# Patient Record
Sex: Male | Born: 1965
Health system: Southern US, Community
[De-identification: ages and names within clinical notes are randomized; demographics above are authoritative.]

## PROBLEM LIST (undated history)

## (undated) DIAGNOSIS — G44009 Cluster headache syndrome, unspecified, not intractable: Secondary | ICD-10-CM

## (undated) DIAGNOSIS — E7800 Pure hypercholesterolemia, unspecified: Secondary | ICD-10-CM

## (undated) DIAGNOSIS — I10 Essential (primary) hypertension: Secondary | ICD-10-CM

## (undated) DIAGNOSIS — E119 Type 2 diabetes mellitus without complications: Secondary | ICD-10-CM

## (undated) DIAGNOSIS — R972 Elevated prostate specific antigen [PSA]: Secondary | ICD-10-CM

## (undated) DIAGNOSIS — E049 Nontoxic goiter, unspecified: Secondary | ICD-10-CM

## (undated) DIAGNOSIS — L409 Psoriasis, unspecified: Secondary | ICD-10-CM

## (undated) HISTORY — DX: Cluster headache syndrome, unspecified, not intractable: G44.009

## (undated) HISTORY — DX: Type 2 diabetes mellitus without complications: E11.9

## (undated) HISTORY — DX: Essential (primary) hypertension: I10

## (undated) HISTORY — DX: Elevated prostate specific antigen (PSA): R97.20

## (undated) HISTORY — DX: Pure hypercholesterolemia, unspecified: E78.00

## (undated) HISTORY — DX: Nontoxic goiter, unspecified: E04.9

## (undated) HISTORY — DX: Psoriasis, unspecified: L40.9

---

## 2011-10-22 ENCOUNTER — Other Ambulatory Visit: Payer: Self-pay | Admitting: Neurology

## 2011-10-22 DIAGNOSIS — R52 Pain, unspecified: Secondary | ICD-10-CM

## 2011-10-23 ENCOUNTER — Ambulatory Visit
Admission: RE | Admit: 2011-10-23 | Discharge: 2011-10-23 | Disposition: A | Discharge status: Home / Self Care | Payer: Managed Care, Other (non HMO) | Source: Ambulatory Visit | Attending: Neurology | Admitting: Neurology

## 2011-10-23 DIAGNOSIS — R52 Pain, unspecified: Secondary | ICD-10-CM

## 2013-08-02 IMAGING — US US CAROTID DUPLEX BILAT
1 series · 13 of 24 positions shown · non-contrast
Comparison: None

CLINICAL DATA: Right-sided pain, carotidynia

BILATERAL CAROTID DUPLEX ULTRASOUND
TECHNIQUE: Gray scale imaging, color Doppler and duplex ultrasound
was performed of bilateral carotid and vertebral arteries in the
neck.

[Series 1: us carotid duplex bilat · 0.08mm/px · 13 of 57 slices shown]
[im 1/57]
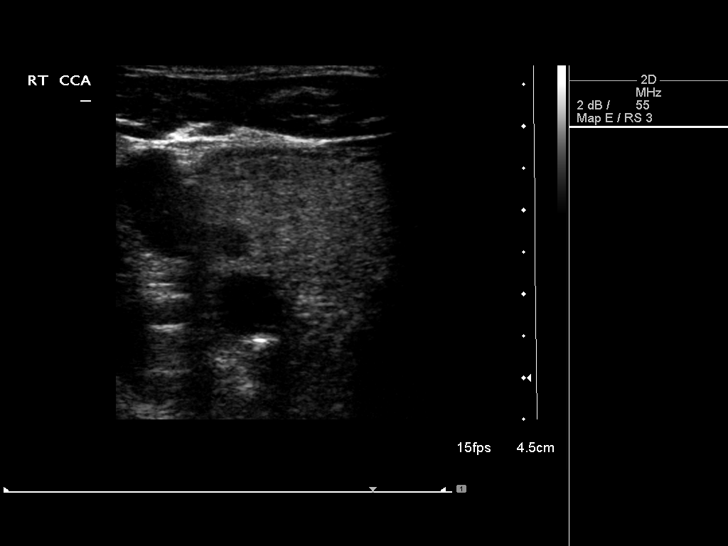
[im 5/57]
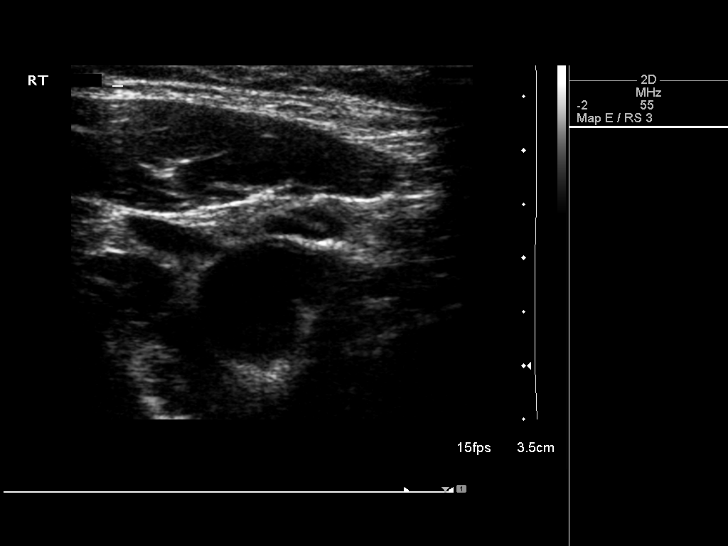
[im 10/57]
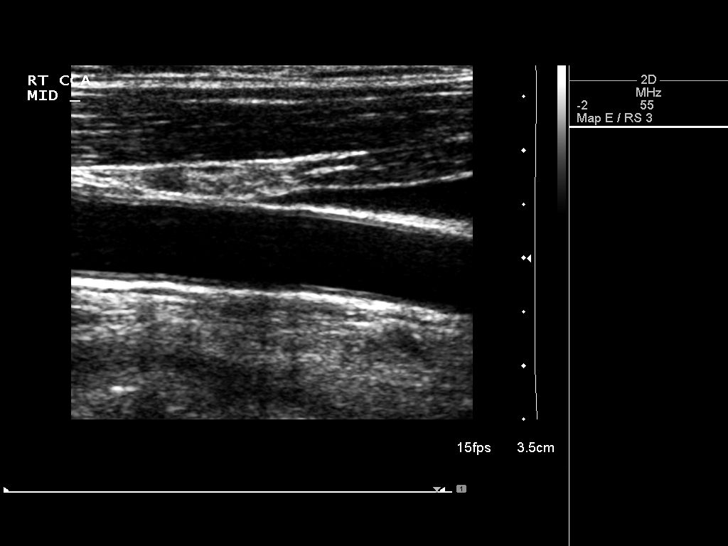
[im 15/57]
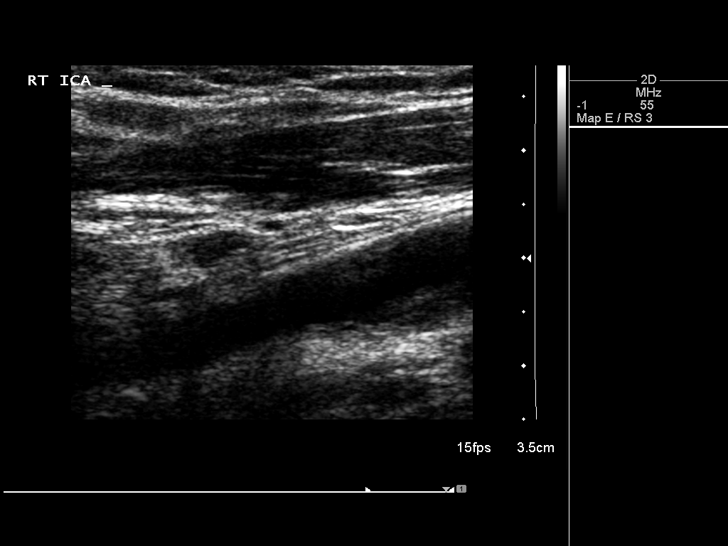
[im 20/57]
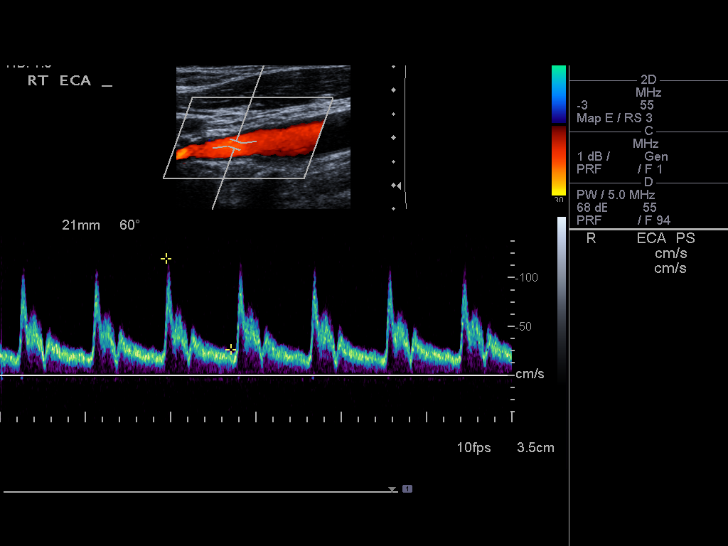
[im 25/57]
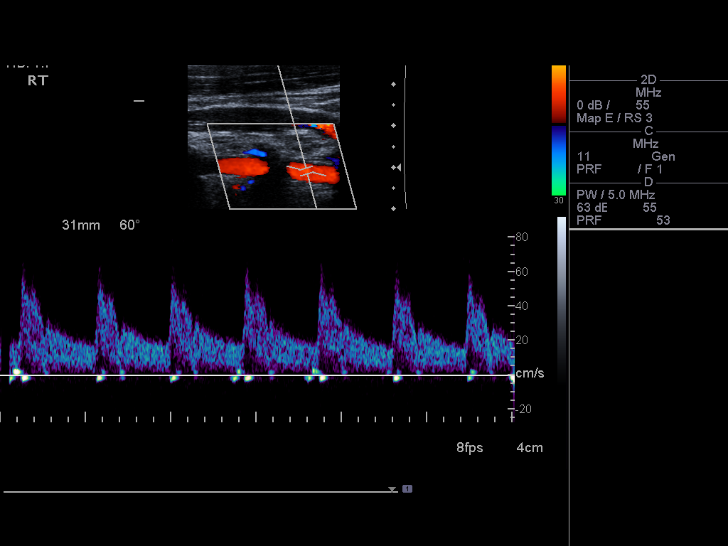
[im 30/57]
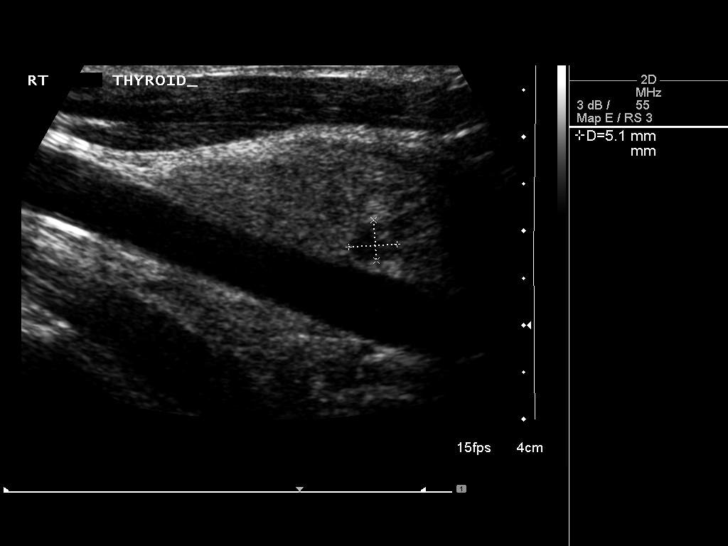
[im 32/57]
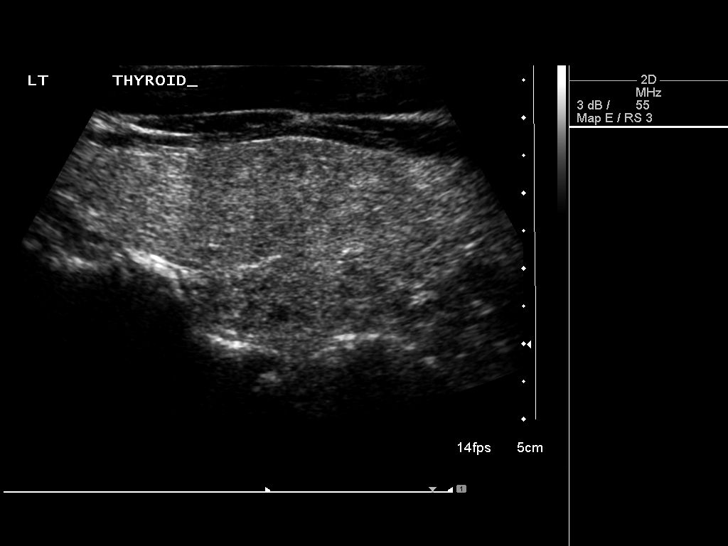
[im 37/57]
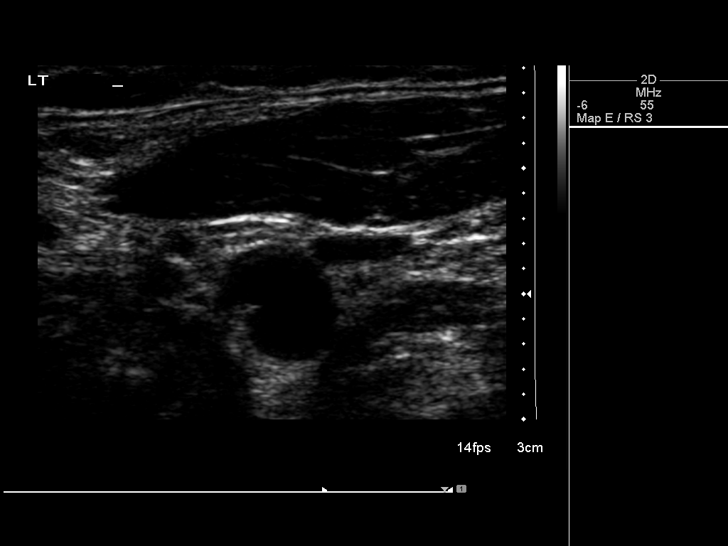
[im 42/57]
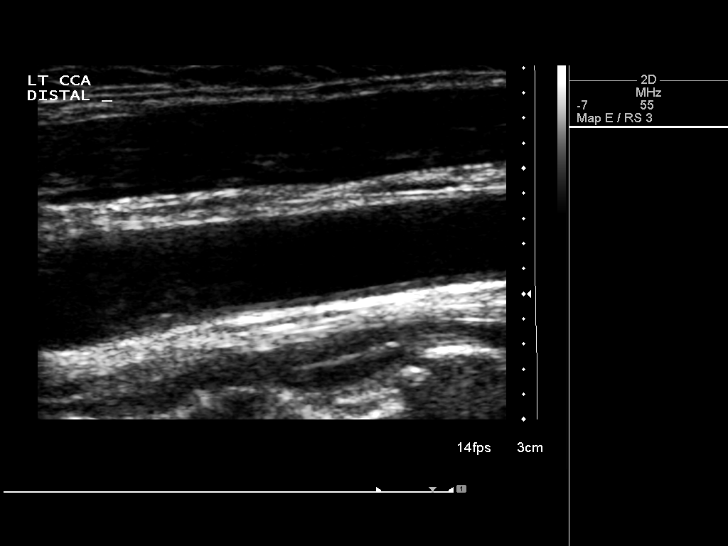
[im 47/57]
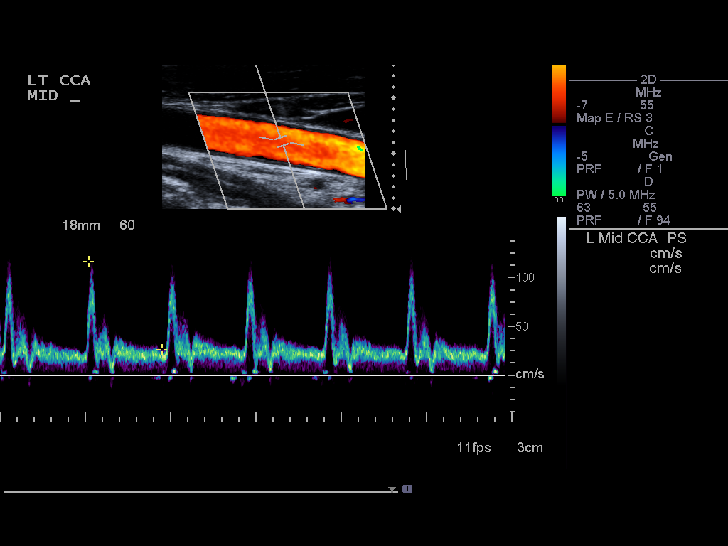
[im 52/57]
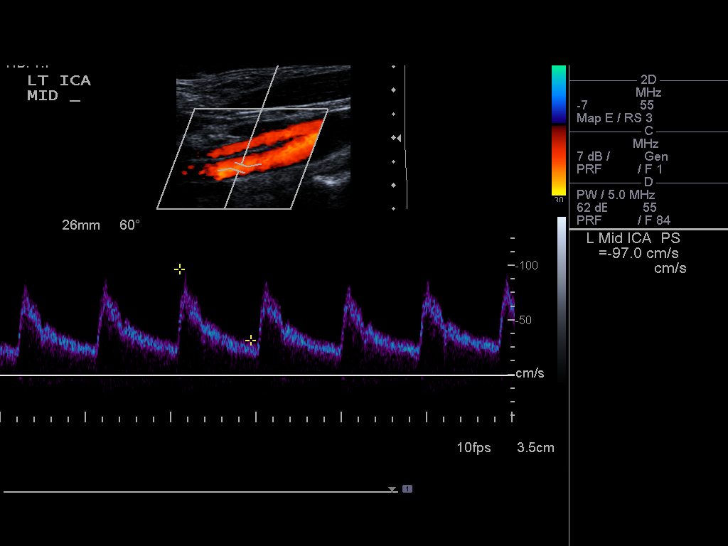
[im 57/57]
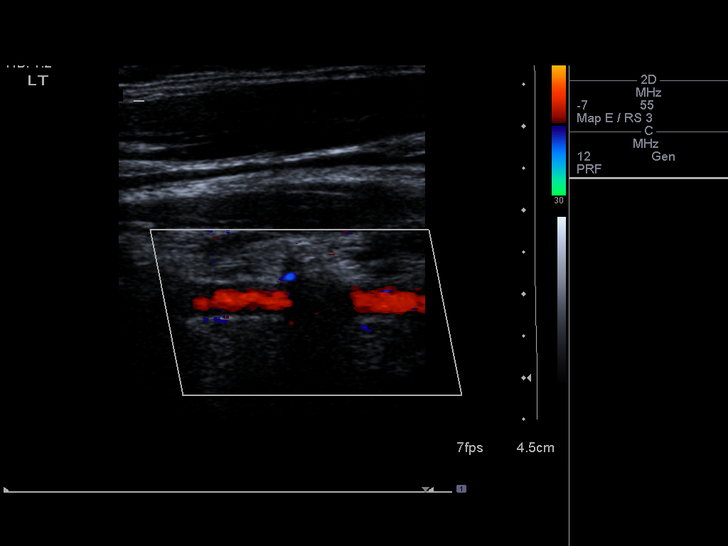

[13 of 24 positions shown; findings below may reference images not displayed]

Criteria:  Quantification of carotid stenosis is based on velocity
parameters that correlate the residual internal carotid diameter
with NASCET-based stenosis levels, using the diameter of the distal
internal carotid lumen as the denominator for stenosis measurement.

The following velocity measurements were obtained:

                 PEAK SYSTOLIC/END DIASTOLIC
RIGHT
ICA:                        87/27cm/sec
CCA:                        115/23cm/sec
SYSTOLIC ICA/CCA RATIO:
DIASTOLIC ICA/CCA RATIO:
ECA:                        120cm/sec

LEFT
ICA:                        97/32cm/sec
CCA:                        117/26cm/sec
SYSTOLIC ICA/CCA RATIO:
DIASTOLIC ICA/CCA RATIO:
ECA:                        132cm/sec
FINDINGS: RIGHT CAROTID ARTERY: Mild noncalcified plaque in the carotid bulb
extending to the proximal ICA without high-grade stenosis.  Normal
wave forms and color Doppler signal.  No dissection flap is
evident.  A 6 mm hypoechoic right thyroid nodule is incidentally
noted.

RIGHT VERTEBRAL ARTERY:  Normal flow direction and waveform.

LEFT CAROTID ARTERY: Mild noncalcified plaque in the carotid bulb
extending to the ICA origin without significant stenosis.  Normal
wave forms and color Doppler signal.  No dissection flap is
evident.

LEFT VERTEBRAL ARTERY:  Normal flow direction and waveform.
IMPRESSION: 1.  Mild bilateral carotid bifurcation plaque resulting in less
than 50% diameter stenosis. The exam does not exclude plaque
ulceration or embolization.  Continued surveillance recommended.
2.  Small right thyroid nodule.  Consider dedicated thyroid
ultrasound for complete evaluation.

## 2019-02-15 ENCOUNTER — Ambulatory Visit: Payer: Self-pay | Admitting: Podiatry

## 2019-05-05 DIAGNOSIS — R972 Elevated prostate specific antigen [PSA]: Secondary | ICD-10-CM | POA: Diagnosis not present

## 2019-06-22 DIAGNOSIS — G43809 Other migraine, not intractable, without status migrainosus: Secondary | ICD-10-CM | POA: Diagnosis not present

## 2019-07-05 DIAGNOSIS — G44019 Episodic cluster headache, not intractable: Secondary | ICD-10-CM | POA: Diagnosis not present

## 2019-07-05 DIAGNOSIS — F5102 Adjustment insomnia: Secondary | ICD-10-CM | POA: Diagnosis not present

## 2019-07-05 DIAGNOSIS — G43009 Migraine without aura, not intractable, without status migrainosus: Secondary | ICD-10-CM | POA: Diagnosis not present

## 2019-07-18 DIAGNOSIS — E7800 Pure hypercholesterolemia, unspecified: Secondary | ICD-10-CM | POA: Diagnosis not present

## 2019-07-18 DIAGNOSIS — G44009 Cluster headache syndrome, unspecified, not intractable: Secondary | ICD-10-CM | POA: Diagnosis not present

## 2019-07-18 DIAGNOSIS — Z6824 Body mass index (BMI) 24.0-24.9, adult: Secondary | ICD-10-CM | POA: Diagnosis not present

## 2019-07-18 DIAGNOSIS — E1165 Type 2 diabetes mellitus with hyperglycemia: Secondary | ICD-10-CM | POA: Diagnosis not present

## 2019-07-18 DIAGNOSIS — Z23 Encounter for immunization: Secondary | ICD-10-CM | POA: Diagnosis not present

## 2019-07-19 DIAGNOSIS — G44019 Episodic cluster headache, not intractable: Secondary | ICD-10-CM | POA: Diagnosis not present

## 2019-08-04 DIAGNOSIS — R05 Cough: Secondary | ICD-10-CM | POA: Diagnosis not present

## 2019-08-04 DIAGNOSIS — Z20828 Contact with and (suspected) exposure to other viral communicable diseases: Secondary | ICD-10-CM | POA: Diagnosis not present

## 2019-08-04 DIAGNOSIS — K591 Functional diarrhea: Secondary | ICD-10-CM | POA: Diagnosis not present

## 2019-09-11 DIAGNOSIS — E1165 Type 2 diabetes mellitus with hyperglycemia: Secondary | ICD-10-CM | POA: Diagnosis not present

## 2019-09-11 DIAGNOSIS — R5383 Other fatigue: Secondary | ICD-10-CM | POA: Diagnosis not present

## 2019-09-11 DIAGNOSIS — Z6824 Body mass index (BMI) 24.0-24.9, adult: Secondary | ICD-10-CM | POA: Diagnosis not present

## 2019-09-11 DIAGNOSIS — G44009 Cluster headache syndrome, unspecified, not intractable: Secondary | ICD-10-CM | POA: Diagnosis not present

## 2019-09-11 DIAGNOSIS — R5381 Other malaise: Secondary | ICD-10-CM | POA: Diagnosis not present

## 2019-09-11 DIAGNOSIS — D72829 Elevated white blood cell count, unspecified: Secondary | ICD-10-CM | POA: Diagnosis not present

## 2019-09-19 ENCOUNTER — Encounter: Payer: Self-pay | Admitting: *Deleted

## 2019-09-20 ENCOUNTER — Ambulatory Visit: Payer: BC Managed Care – PPO | Admitting: Diagnostic Neuroimaging

## 2019-09-20 ENCOUNTER — Encounter: Payer: Self-pay | Admitting: Diagnostic Neuroimaging

## 2019-09-20 ENCOUNTER — Other Ambulatory Visit: Payer: Self-pay

## 2019-09-20 VITALS — BP 132/81 | HR 69 | Temp 97.7°F | Ht 69.0 in | Wt 178.0 lb

## 2019-09-20 DIAGNOSIS — G44011 Episodic cluster headache, intractable: Secondary | ICD-10-CM | POA: Diagnosis not present

## 2019-09-20 NOTE — Progress Notes (Signed)
[facility]  PATIENT: Richard Clay DOB: 01/12/1966  REFERRING CLINICIAN: Noni Saupe, MD  HISTORY FROM: patient  REASON FOR VISIT: new consult / second opinion   HISTORICAL  CHIEF COMPLAINT:  Chief Complaint  Patient presents with  . Cluster headache    rm 6 New Pt "right sided headaches, cluster; began age 53's, tx from past not working any more"    HISTORY OF PRESENT ILLNESS:   NEW HPI (09/20/19, VRP): 53 year old male here for evaluation of second opinion of cluster headaches.  Outside records were reviewed and summarized.  Patient started having headaches starting around age 27 or 54 years old.  He was living in [place] at the time.  He was having intermittent headaches over his right eye, right nose, right head, sharp intense pain lasting a few minutes or few hours at a time.  This was associated with scleral injection, eye pain, neck pain, sensitivity to light and sound, nausea and vomiting, nasal congestion on the right side.  Sometimes pain would radiate to the is right shoulder and right neck.  Triggering factors which include dry air, heat, alcohol.  Patient was diagnosed with episodic cluster headaches and started on propranolol which helped initially.    Then patient moved to [place] was seen by headache specialist Dr. Clarisse Gouge, and continued on propranolol.  He would have about 4-5 headache clusters per year, with a seasonal pattern.  Typically small dose of prednisone and allergy medicine Zyrtec would seem to relieve symptoms.  Then patient saw Dr. Caryn Section and Dr. Sharene Skeans headache specialists in 2019 and 2020.  He was tried on amlodipine, propranolol and prescribed sumatriptan.  Patient never tried the sumatriptan.  Patient was also prescribed gabapentin but he never tried this.  Propranolol was decreased and tapered off and amlodipine was increased but this did not seem to help.  Patient has tried oxygen in the past but he is not sure how much  this helped (this may have been more than 20 years ago).  Patient is frustrated, anxious, scared of new medications, scared of new changes.  He would prefer to be on daily prednisone although this is of some medical concern due to his hypertension and diabetes.  Patient has tried in the past propranolol, olmesartan, verapamil, Topamax, Emgality, sumatriptan.   03/30/18 Dr. Caryn Section: "This is a recurrent problem. The current episode started more than 1 year ago. The problem occurs intermittently. The problem has been gradually worsening. The pain is located in the occipital and right unilateral region. The pain radiates to the upper back and right neck. The quality of the pain is described as throbbing, dull and squeezing (pressure). Associated symptoms include eye pain, eye redness, nausea, neck pain, phonophobia, photophobia and vomiting. Pertinent negatives include no blurred vision or dizziness. The symptoms are aggravated by alcohol, bright light and noise (heat, dry air). He has tried triptans, beta blockers and oxygen for the symptoms. There is no history of migraines in the family.   This is the first visit for this patient to this clinic. Pt has had headaches for many years. He was diagnosed w/ episodic cluster headaches by a neurologist in [place]. He has seen Dr Amelia Jo in the past also. Propranolol helped helped decrease HAs x 4-5 y but they returned recently (1 month ago). Pt will get HAs that last 1-9monutes up to 4+ hours if not treated. Pt has taken prednisone at onset of HAs and it usually helps. Sometimes he had  to take several rounds. He just finished 2nd round on Sunday. Pt tried sumatriptan 1/4 tab but it made him numb all over. It did help him sleep and relieved HA.  Headaches usually start right sided neck pain on right, move up and over head to face next to nose. Sometimes they last less than 3 m especially if he does stretches. Sometimes they last at least 4h. He has HAs about 1-1.5h  after going to sleep- at about 9:30 every night this past month. Pt has rhinorrhea, post nasal drainage (if he swallow the drainage, HAs may decrease), coughing, eye swelling. He tries to relax to manage the pain. Then he might need to stretch or eventually pace/ move. Pain can be unbearable. He tried oxygen many years ago but now prefers to have a fan blowing on his face w/ cool air. He denies a visual aura.   Pt has HTN controlled by meds and he avoids NSAID's.  Duration: 4-72h Frequency: daily Progression: waxing and waning  Pt does drink caffeinated beverages daily. Pt does drink alcoholic beverages. Pt does smoke. Pt does not exercise regularly. Pt has irregular sleep patterns when in a bad cycle. 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  Current and past medications: ANTI-MIGRAINE: Imitrex- made him numb all over HEART/BP: Norvasc, propranolol, olmesartan, Verapamil ANTI-CONVULSANTS: Topamax"    REVIEW OF SYSTEMS: Full 14 system review of systems performed and negative with exception of: As per HPI.  Headache weakness cough not no sleep decreased energy.  ALLERGIES: Allergies  Allergen Reactions  . Augmentin [Amoxicillin-Pot Clavulanate] Nausea Only  . Cefdinir Nausea And Vomiting    diarrhea  . Verapamil Hcl Er Nausea And Vomiting    HOME MEDICATIONS: Outpatient Medications Prior to Visit  Medication Sig Dispense Refill  . amLODipine (NORVASC) 5 MG tablet Take 5 mg by mouth daily.    . Aspirin Buf,CaCarb-MgCarb-MgO, 81 MG TABS Take 81 mg by mouth daily.    . cetirizine (ZYRTEC) 10 MG tablet Take 10 mg by mouth daily.    Marland Kitchen CINNAMON PO Take 2,000 mg by mouth daily.    Marland Kitchen EMGALITY 120 MG/ML SOAJ ADM 1 PEN [place] Q 30 DAYS    . glimepiride (AMARYL) 2 MG tablet Take 2 mg by mouth daily.    . metFORMIN (GLUCOPHAGE-XR) 500 MG 24 hr tablet Take 1,000 mg by mouth at bedtime.    Marland Kitchen olmesartan (BENICAR) 20 MG tablet Take 20 mg by mouth daily.    . Omega-3 1000 MG CAPS Take by mouth.    .  Probiotic Product (PROBIOTIC PO) Take by mouth. Couple x week    . propranolol (INDERAL) 40 MG tablet Take 40 mg by mouth daily.    . Vitamins/Minerals TABS Take by mouth.    . gabapentin (NEURONTIN) 300 MG capsule Take 300 mg by mouth at bedtime. 09/20/19 not taking    . Ubrogepant (UBRELVY) 100 MG TABS Take 100 mg by mouth. 09/20/19 not taking     No facility-administered medications prior to visit.    PAST MEDICAL HISTORY: Past Medical History:  Diagnosis Date  . Cluster headache   . Diabetes (HCC)    type 2  . Elevated PSA   . Goiter   . High cholesterol   . Hypertension   . Psoriasis     PAST SURGICAL HISTORY: Past Surgical History:  Procedure Laterality Date  . ORIF ANKLE FRACTURE Left     FAMILY HISTORY: Family History  Problem Relation Age of Onset  .  Diabetes Father   . Lung cancer Father   . Prostate cancer Father   . Kidney failure Brother        dialysis  . Cancer Mother        breast  . Cancer Paternal Uncle   . Cancer Paternal Grandfather        bone    SOCIAL HISTORY: Social History   Socioeconomic History  . Marital status: Married    Spouse name: Seth Bake  . Number of children: 2  . Years of education: college  . Highest education level: Not on file  Occupational History  . Not on file  Tobacco Use  . Smoking status: Current Every Day Smoker    Packs/day: 1.00  . Smokeless tobacco: Never Used  Substance and Sexual Activity  . Alcohol use: Yes    Alcohol/week: 1.0 standard drinks    Types: 1 Glasses of wine per week    Comment: 09/19/19  very little  . Drug use: Not Currently  . Sexual activity: Not on file  Other Topics Concern  . Not on file  Social History Narrative   Lives with wife   Caffeine- decaf   Social Determinants of Health   Financial Resource Strain:   . Difficulty of Paying Living Expenses: Not on file  Food Insecurity:   . Worried About Charity fundraiser in the Last Year: Not on file  . Ran Out of Food in the  Last Year: Not on file  Transportation Needs:   . Lack of Transportation (Medical): Not on file  . Lack of Transportation (Non-Medical): Not on file  Physical Activity:   . Days of Exercise per Week: Not on file  . Minutes of Exercise per Session: Not on file  Stress:   . Feeling of Stress : Not on file  Social Connections:   . Frequency of Communication with Friends and Family: Not on file  . Frequency of Social Gatherings with Friends and Family: Not on file  . Attends Religious Services: Not on file  . Active Member of Clubs or Organizations: Not on file  . Attends Archivist Meetings: Not on file  . Marital Status: Not on file  Intimate Partner Violence:   . Fear of Current or Ex-Partner: Not on file  . Emotionally Abused: Not on file  . Physically Abused: Not on file  . Sexually Abused: Not on file     PHYSICAL EXAM  GENERAL EXAM/CONSTITUTIONAL: Vitals:  Vitals:   09/20/19 0902  BP: 132/81  Pulse: 69  Temp: 97.7 F (36.5 C)  Weight: 178 lb (80.7 kg)  Height: 5\' 9"  (1.753 m)     Body mass index is 26.29 kg/m. Wt Readings from Last 3 Encounters:  09/20/19 178 lb (80.7 kg)     Patient is in no distress; well developed, nourished and groomed; neck is supple  CARDIOVASCULAR:  Examination of carotid arteries is normal; no carotid bruits  Regular rate and rhythm, no murmurs  Examination of peripheral vascular system by observation and palpation is normal  EYES:  Ophthalmoscopic exam of optic discs and posterior segments is normal; no papilledema or hemorrhages  No exam data present  MUSCULOSKELETAL:  Gait, strength, tone, movements noted in Neurologic exam below  NEUROLOGIC: MENTAL STATUS:  No flowsheet data found.  awake, alert, oriented to person, place and time  recent and remote memory intact  normal attention and concentration  language fluent, comprehension intact, naming intact  fund of knowledge appropriate  CRANIAL  NERVE:   2nd - no papilledema on fundoscopic exam  2nd, 3rd, 4th, 6th - pupils equal and reactive to light, visual fields full to confrontation, extraocular muscles intact, no nystagmus  5th - facial sensation symmetric  7th - facial strength symmetric  8th - hearing intact  9th - palate elevates symmetrically, uvula midline  11th - shoulder shrug symmetric  12th - tongue protrusion midline  MOTOR:   normal bulk and tone, full strength in the BUE, BLE  SENSORY:   normal and symmetric to light touch, temperature, vibration  COORDINATION:   finger-nose-finger, fine finger movements normal  REFLEXES:   deep tendon reflexes present and symmetric  GAIT/STATION:   narrow based gait     DIAGNOSTIC DATA (LABS, IMAGING, TESTING) - I reviewed patient records, labs, notes, testing and imaging myself where available.  No results found for: WBC, HGB, HCT, MCV, PLT No results found for: NA, K, CL, CO2, GLUCOSE, BUN, CREATININE, CALCIUM, PROT, ALBUMIN, AST, ALT, ALKPHOS, BILITOT, GFRNONAA, GFRAA No results found for: CHOL, HDL, LDLCALC, LDLDIRECT, TRIG, CHOLHDL No results found for: [id number] No results found for: VITAMINB12 No results found for: TSH     ASSESSMENT AND PLAN  53 y.o. year old male here with history of episodic cluster headaches since age 53 years old, currently on propranolol, amlodipine, emgality.   Dx:  1. Intractable episodic cluster headache     PLAN:  INTRACTABLE CLUSTER HEADACHES (second opinion) - CLUSTER PREVENTION --> continue emgality 120mg  monthly, propranolol 40mg  daily, amlodipine 5mg   - CLUSTER RESCUE --> consider sumatriptan as needed (if patient agrees); consider home oxygen  - Reviewed other options of medication treatment, but patient is reluctant to try anything new at this time; recommend he continue current treatment and follow-up with headache specialist.  Return return to headache specialist, for return to PCP, pending if  symptoms worsen or fail to improve.    Suanne MarkerVIKRAM R. Eleisha Branscomb, MD 09/20/2019, 9:56 AM Certified in Neurology, Neurophysiology and Neuroimaging  [facility] [address], [address] [place], [place] [address] [phone] a

## 2019-09-20 NOTE — Patient Instructions (Signed)
INTRACTABLE CLUSTER HEADACHES   - CLUSTER PREVENTION --> continue emgality 120mg  monthly, propranolol 40mg  daily, amlodipine 5mg   - CLUSTER RESCUE --> consider sumatriptan as needed; consider home oxygen

## 2019-10-04 DIAGNOSIS — G44019 Episodic cluster headache, not intractable: Secondary | ICD-10-CM | POA: Diagnosis not present

## 2019-10-20 DIAGNOSIS — F17200 Nicotine dependence, unspecified, uncomplicated: Secondary | ICD-10-CM | POA: Diagnosis not present

## 2019-10-20 DIAGNOSIS — G44009 Cluster headache syndrome, unspecified, not intractable: Secondary | ICD-10-CM | POA: Diagnosis not present

## 2019-10-20 DIAGNOSIS — R899 Unspecified abnormal finding in specimens from other organs, systems and tissues: Secondary | ICD-10-CM | POA: Diagnosis not present

## 2019-10-20 DIAGNOSIS — Z6825 Body mass index (BMI) 25.0-25.9, adult: Secondary | ICD-10-CM | POA: Diagnosis not present

## 2019-10-20 DIAGNOSIS — R7989 Other specified abnormal findings of blood chemistry: Secondary | ICD-10-CM | POA: Diagnosis not present

## 2019-10-20 DIAGNOSIS — E049 Nontoxic goiter, unspecified: Secondary | ICD-10-CM | POA: Diagnosis not present

## 2019-10-20 DIAGNOSIS — E1165 Type 2 diabetes mellitus with hyperglycemia: Secondary | ICD-10-CM | POA: Diagnosis not present

## 2019-10-25 DIAGNOSIS — E049 Nontoxic goiter, unspecified: Secondary | ICD-10-CM | POA: Diagnosis not present

## 2019-10-31 DIAGNOSIS — G44011 Episodic cluster headache, intractable: Secondary | ICD-10-CM | POA: Diagnosis not present

## 2019-12-05 DIAGNOSIS — Z20828 Contact with and (suspected) exposure to other viral communicable diseases: Secondary | ICD-10-CM | POA: Diagnosis not present

## 2020-01-12 DIAGNOSIS — R899 Unspecified abnormal finding in specimens from other organs, systems and tissues: Secondary | ICD-10-CM | POA: Diagnosis not present

## 2020-01-12 DIAGNOSIS — Z1331 Encounter for screening for depression: Secondary | ICD-10-CM | POA: Diagnosis not present

## 2020-01-12 DIAGNOSIS — I10 Essential (primary) hypertension: Secondary | ICD-10-CM | POA: Diagnosis not present

## 2020-01-12 DIAGNOSIS — Z6825 Body mass index (BMI) 25.0-25.9, adult: Secondary | ICD-10-CM | POA: Diagnosis not present

## 2020-01-12 DIAGNOSIS — E1165 Type 2 diabetes mellitus with hyperglycemia: Secondary | ICD-10-CM | POA: Diagnosis not present

## 2020-01-25 DIAGNOSIS — F17200 Nicotine dependence, unspecified, uncomplicated: Secondary | ICD-10-CM | POA: Diagnosis not present

## 2020-01-25 DIAGNOSIS — J3089 Other allergic rhinitis: Secondary | ICD-10-CM | POA: Diagnosis not present

## 2020-04-30 DIAGNOSIS — F17200 Nicotine dependence, unspecified, uncomplicated: Secondary | ICD-10-CM | POA: Diagnosis not present

## 2020-04-30 DIAGNOSIS — J329 Chronic sinusitis, unspecified: Secondary | ICD-10-CM | POA: Diagnosis not present

## 2020-04-30 DIAGNOSIS — Z20828 Contact with and (suspected) exposure to other viral communicable diseases: Secondary | ICD-10-CM | POA: Diagnosis not present

## 2020-04-30 DIAGNOSIS — J40 Bronchitis, not specified as acute or chronic: Secondary | ICD-10-CM | POA: Diagnosis not present

## 2020-10-24 DIAGNOSIS — E1121 Type 2 diabetes mellitus with diabetic nephropathy: Secondary | ICD-10-CM | POA: Diagnosis not present

## 2020-10-24 DIAGNOSIS — Z125 Encounter for screening for malignant neoplasm of prostate: Secondary | ICD-10-CM | POA: Diagnosis not present

## 2020-10-24 DIAGNOSIS — K219 Gastro-esophageal reflux disease without esophagitis: Secondary | ICD-10-CM | POA: Diagnosis not present

## 2020-10-24 DIAGNOSIS — E7800 Pure hypercholesterolemia, unspecified: Secondary | ICD-10-CM | POA: Diagnosis not present

## 2020-10-24 DIAGNOSIS — Z6827 Body mass index (BMI) 27.0-27.9, adult: Secondary | ICD-10-CM | POA: Diagnosis not present

## 2021-01-09 ENCOUNTER — Encounter: Payer: Self-pay | Admitting: Gastroenterology

## 2021-02-12 DIAGNOSIS — L958 Other vasculitis limited to the skin: Secondary | ICD-10-CM | POA: Diagnosis not present

## 2021-02-12 DIAGNOSIS — Z79899 Other long term (current) drug therapy: Secondary | ICD-10-CM | POA: Diagnosis not present

## 2021-02-12 DIAGNOSIS — Z111 Encounter for screening for respiratory tuberculosis: Secondary | ICD-10-CM | POA: Diagnosis not present

## 2021-02-12 DIAGNOSIS — L400 Psoriasis vulgaris: Secondary | ICD-10-CM | POA: Diagnosis not present

## 2021-05-27 DIAGNOSIS — E119 Type 2 diabetes mellitus without complications: Secondary | ICD-10-CM | POA: Diagnosis not present

## 2021-05-27 DIAGNOSIS — E785 Hyperlipidemia, unspecified: Secondary | ICD-10-CM | POA: Diagnosis not present

## 2021-05-27 DIAGNOSIS — Z79899 Other long term (current) drug therapy: Secondary | ICD-10-CM | POA: Diagnosis not present

## 2021-05-27 DIAGNOSIS — Z87891 Personal history of nicotine dependence: Secondary | ICD-10-CM | POA: Diagnosis not present

## 2021-05-27 DIAGNOSIS — R972 Elevated prostate specific antigen [PSA]: Secondary | ICD-10-CM | POA: Diagnosis not present

## 2021-05-27 DIAGNOSIS — L259 Unspecified contact dermatitis, unspecified cause: Secondary | ICD-10-CM | POA: Diagnosis not present

## 2021-05-27 DIAGNOSIS — E1169 Type 2 diabetes mellitus with other specified complication: Secondary | ICD-10-CM | POA: Diagnosis not present

## 2021-06-06 DIAGNOSIS — R972 Elevated prostate specific antigen [PSA]: Secondary | ICD-10-CM | POA: Diagnosis not present

## 2021-06-06 DIAGNOSIS — L2089 Other atopic dermatitis: Secondary | ICD-10-CM | POA: Diagnosis not present

## 2021-06-06 DIAGNOSIS — L0390 Cellulitis, unspecified: Secondary | ICD-10-CM | POA: Diagnosis not present

## 2021-06-10 DIAGNOSIS — L400 Psoriasis vulgaris: Secondary | ICD-10-CM | POA: Diagnosis not present

## 2021-06-10 DIAGNOSIS — L237 Allergic contact dermatitis due to plants, except food: Secondary | ICD-10-CM | POA: Diagnosis not present

## 2021-06-10 DIAGNOSIS — L299 Pruritus, unspecified: Secondary | ICD-10-CM | POA: Diagnosis not present

## 2021-06-16 DIAGNOSIS — Z87891 Personal history of nicotine dependence: Secondary | ICD-10-CM | POA: Diagnosis not present

## 2021-06-16 DIAGNOSIS — J449 Chronic obstructive pulmonary disease, unspecified: Secondary | ICD-10-CM | POA: Diagnosis not present

## 2021-06-16 DIAGNOSIS — Z122 Encounter for screening for malignant neoplasm of respiratory organs: Secondary | ICD-10-CM | POA: Diagnosis not present

## 2021-06-16 DIAGNOSIS — F17210 Nicotine dependence, cigarettes, uncomplicated: Secondary | ICD-10-CM | POA: Diagnosis not present

## 2021-06-27 DIAGNOSIS — R972 Elevated prostate specific antigen [PSA]: Secondary | ICD-10-CM | POA: Diagnosis not present

## 2021-07-30 DIAGNOSIS — F419 Anxiety disorder, unspecified: Secondary | ICD-10-CM | POA: Diagnosis not present

## 2021-07-30 DIAGNOSIS — Z23 Encounter for immunization: Secondary | ICD-10-CM | POA: Diagnosis not present

## 2021-07-30 DIAGNOSIS — I10 Essential (primary) hypertension: Secondary | ICD-10-CM | POA: Diagnosis not present

## 2021-07-30 DIAGNOSIS — Z87891 Personal history of nicotine dependence: Secondary | ICD-10-CM | POA: Diagnosis not present

## 2021-09-08 DIAGNOSIS — J449 Chronic obstructive pulmonary disease, unspecified: Secondary | ICD-10-CM | POA: Diagnosis not present

## 2021-09-08 DIAGNOSIS — J441 Chronic obstructive pulmonary disease with (acute) exacerbation: Secondary | ICD-10-CM | POA: Diagnosis not present

## 2021-10-24 DIAGNOSIS — S00252A Superficial foreign body of left eyelid and periocular area, initial encounter: Secondary | ICD-10-CM | POA: Diagnosis not present

## 2021-10-24 DIAGNOSIS — R972 Elevated prostate specific antigen [PSA]: Secondary | ICD-10-CM | POA: Diagnosis not present

## 2021-10-24 DIAGNOSIS — J3489 Other specified disorders of nose and nasal sinuses: Secondary | ICD-10-CM | POA: Diagnosis not present

## 2021-10-24 DIAGNOSIS — X58XXXA Exposure to other specified factors, initial encounter: Secondary | ICD-10-CM | POA: Diagnosis not present

## 2021-11-17 DIAGNOSIS — R972 Elevated prostate specific antigen [PSA]: Secondary | ICD-10-CM | POA: Diagnosis not present

## 2021-11-17 DIAGNOSIS — J449 Chronic obstructive pulmonary disease, unspecified: Secondary | ICD-10-CM | POA: Diagnosis not present

## 2021-11-17 DIAGNOSIS — E119 Type 2 diabetes mellitus without complications: Secondary | ICD-10-CM | POA: Diagnosis not present

## 2021-11-17 DIAGNOSIS — J069 Acute upper respiratory infection, unspecified: Secondary | ICD-10-CM | POA: Diagnosis not present

## 2021-12-02 DIAGNOSIS — E1169 Type 2 diabetes mellitus with other specified complication: Secondary | ICD-10-CM | POA: Diagnosis not present

## 2021-12-02 DIAGNOSIS — E119 Type 2 diabetes mellitus without complications: Secondary | ICD-10-CM | POA: Diagnosis not present

## 2021-12-02 DIAGNOSIS — I10 Essential (primary) hypertension: Secondary | ICD-10-CM | POA: Diagnosis not present

## 2021-12-02 DIAGNOSIS — E785 Hyperlipidemia, unspecified: Secondary | ICD-10-CM | POA: Diagnosis not present

## 2021-12-02 DIAGNOSIS — Z79899 Other long term (current) drug therapy: Secondary | ICD-10-CM | POA: Diagnosis not present

## 2021-12-08 DIAGNOSIS — R972 Elevated prostate specific antigen [PSA]: Secondary | ICD-10-CM | POA: Diagnosis not present

## 2022-02-13 DIAGNOSIS — Z880 Allergy status to penicillin: Secondary | ICD-10-CM | POA: Diagnosis not present

## 2022-02-13 DIAGNOSIS — Z79899 Other long term (current) drug therapy: Secondary | ICD-10-CM | POA: Diagnosis not present

## 2022-02-13 DIAGNOSIS — R972 Elevated prostate specific antigen [PSA]: Secondary | ICD-10-CM | POA: Diagnosis not present

## 2022-02-13 DIAGNOSIS — E119 Type 2 diabetes mellitus without complications: Secondary | ICD-10-CM | POA: Diagnosis not present

## 2022-02-13 DIAGNOSIS — D291 Benign neoplasm of prostate: Secondary | ICD-10-CM | POA: Diagnosis not present

## 2022-02-13 DIAGNOSIS — I10 Essential (primary) hypertension: Secondary | ICD-10-CM | POA: Diagnosis not present

## 2022-02-13 DIAGNOSIS — F17210 Nicotine dependence, cigarettes, uncomplicated: Secondary | ICD-10-CM | POA: Diagnosis not present

## 2022-02-13 DIAGNOSIS — C61 Malignant neoplasm of prostate: Secondary | ICD-10-CM | POA: Diagnosis not present

## 2022-03-04 DIAGNOSIS — R972 Elevated prostate specific antigen [PSA]: Secondary | ICD-10-CM | POA: Diagnosis not present

## 2022-04-17 DIAGNOSIS — Z79899 Other long term (current) drug therapy: Secondary | ICD-10-CM | POA: Diagnosis not present

## 2022-04-17 DIAGNOSIS — E1169 Type 2 diabetes mellitus with other specified complication: Secondary | ICD-10-CM | POA: Diagnosis not present

## 2022-04-17 DIAGNOSIS — E119 Type 2 diabetes mellitus without complications: Secondary | ICD-10-CM | POA: Diagnosis not present

## 2022-04-17 DIAGNOSIS — I10 Essential (primary) hypertension: Secondary | ICD-10-CM | POA: Diagnosis not present

## 2022-04-17 DIAGNOSIS — R972 Elevated prostate specific antigen [PSA]: Secondary | ICD-10-CM | POA: Diagnosis not present

## 2022-04-17 DIAGNOSIS — E785 Hyperlipidemia, unspecified: Secondary | ICD-10-CM | POA: Diagnosis not present

## 2022-05-18 DIAGNOSIS — E785 Hyperlipidemia, unspecified: Secondary | ICD-10-CM | POA: Diagnosis not present

## 2022-05-18 DIAGNOSIS — M25512 Pain in left shoulder: Secondary | ICD-10-CM | POA: Diagnosis not present

## 2022-05-18 DIAGNOSIS — R972 Elevated prostate specific antigen [PSA]: Secondary | ICD-10-CM | POA: Diagnosis not present

## 2022-05-18 DIAGNOSIS — Z79899 Other long term (current) drug therapy: Secondary | ICD-10-CM | POA: Diagnosis not present

## 2022-05-18 DIAGNOSIS — E1169 Type 2 diabetes mellitus with other specified complication: Secondary | ICD-10-CM | POA: Diagnosis not present

## 2022-08-18 DIAGNOSIS — E785 Hyperlipidemia, unspecified: Secondary | ICD-10-CM | POA: Diagnosis not present

## 2022-08-18 DIAGNOSIS — R972 Elevated prostate specific antigen [PSA]: Secondary | ICD-10-CM | POA: Diagnosis not present

## 2022-08-18 DIAGNOSIS — Z79899 Other long term (current) drug therapy: Secondary | ICD-10-CM | POA: Diagnosis not present

## 2022-08-18 DIAGNOSIS — E1169 Type 2 diabetes mellitus with other specified complication: Secondary | ICD-10-CM | POA: Diagnosis not present

## 2022-08-18 DIAGNOSIS — E119 Type 2 diabetes mellitus without complications: Secondary | ICD-10-CM | POA: Diagnosis not present

## 2022-10-02 DIAGNOSIS — G8929 Other chronic pain: Secondary | ICD-10-CM | POA: Diagnosis not present

## 2022-10-02 DIAGNOSIS — M25512 Pain in left shoulder: Secondary | ICD-10-CM | POA: Diagnosis not present

## 2022-11-12 DIAGNOSIS — J449 Chronic obstructive pulmonary disease, unspecified: Secondary | ICD-10-CM | POA: Diagnosis not present

## 2022-11-12 DIAGNOSIS — R051 Acute cough: Secondary | ICD-10-CM | POA: Diagnosis not present

## 2022-11-12 DIAGNOSIS — R0600 Dyspnea, unspecified: Secondary | ICD-10-CM | POA: Diagnosis not present

## 2022-11-12 DIAGNOSIS — R0981 Nasal congestion: Secondary | ICD-10-CM | POA: Diagnosis not present

## 2022-11-12 DIAGNOSIS — M791 Myalgia, unspecified site: Secondary | ICD-10-CM | POA: Diagnosis not present

## 2022-11-20 DIAGNOSIS — Z720 Tobacco use: Secondary | ICD-10-CM | POA: Diagnosis not present

## 2022-11-20 DIAGNOSIS — R972 Elevated prostate specific antigen [PSA]: Secondary | ICD-10-CM | POA: Diagnosis not present

## 2022-11-25 DIAGNOSIS — E785 Hyperlipidemia, unspecified: Secondary | ICD-10-CM | POA: Diagnosis not present

## 2022-11-25 DIAGNOSIS — I10 Essential (primary) hypertension: Secondary | ICD-10-CM | POA: Diagnosis not present

## 2022-11-25 DIAGNOSIS — Z79899 Other long term (current) drug therapy: Secondary | ICD-10-CM | POA: Diagnosis not present

## 2022-11-25 DIAGNOSIS — E119 Type 2 diabetes mellitus without complications: Secondary | ICD-10-CM | POA: Diagnosis not present

## 2022-11-25 DIAGNOSIS — E1169 Type 2 diabetes mellitus with other specified complication: Secondary | ICD-10-CM | POA: Diagnosis not present

## 2023-01-19 DIAGNOSIS — E119 Type 2 diabetes mellitus without complications: Secondary | ICD-10-CM | POA: Diagnosis not present

## 2023-02-16 DIAGNOSIS — R000 Tachycardia, unspecified: Secondary | ICD-10-CM | POA: Diagnosis not present

## 2023-02-16 DIAGNOSIS — R062 Wheezing: Secondary | ICD-10-CM | POA: Diagnosis not present

## 2023-02-16 DIAGNOSIS — R0689 Other abnormalities of breathing: Secondary | ICD-10-CM | POA: Diagnosis not present

## 2023-02-16 DIAGNOSIS — R9431 Abnormal electrocardiogram [ECG] [EKG]: Secondary | ICD-10-CM | POA: Diagnosis not present

## 2023-02-16 DIAGNOSIS — R070 Pain in throat: Secondary | ICD-10-CM | POA: Diagnosis not present

## 2023-02-16 DIAGNOSIS — R0602 Shortness of breath: Secondary | ICD-10-CM | POA: Diagnosis not present

## 2023-02-16 DIAGNOSIS — J449 Chronic obstructive pulmonary disease, unspecified: Secondary | ICD-10-CM | POA: Diagnosis not present

## 2023-02-16 DIAGNOSIS — Z79899 Other long term (current) drug therapy: Secondary | ICD-10-CM | POA: Diagnosis not present

## 2023-02-16 DIAGNOSIS — I4510 Unspecified right bundle-branch block: Secondary | ICD-10-CM | POA: Diagnosis not present

## 2023-02-16 DIAGNOSIS — R0902 Hypoxemia: Secondary | ICD-10-CM | POA: Diagnosis not present

## 2023-02-16 DIAGNOSIS — I517 Cardiomegaly: Secondary | ICD-10-CM | POA: Diagnosis not present

## 2023-02-16 DIAGNOSIS — J441 Chronic obstructive pulmonary disease with (acute) exacerbation: Secondary | ICD-10-CM | POA: Diagnosis not present

## 2023-02-16 DIAGNOSIS — Z7982 Long term (current) use of aspirin: Secondary | ICD-10-CM | POA: Diagnosis not present

## 2023-02-23 DIAGNOSIS — E785 Hyperlipidemia, unspecified: Secondary | ICD-10-CM | POA: Diagnosis not present

## 2023-02-23 DIAGNOSIS — Z79899 Other long term (current) drug therapy: Secondary | ICD-10-CM | POA: Diagnosis not present

## 2023-02-23 DIAGNOSIS — E119 Type 2 diabetes mellitus without complications: Secondary | ICD-10-CM | POA: Diagnosis not present

## 2023-02-23 DIAGNOSIS — E1169 Type 2 diabetes mellitus with other specified complication: Secondary | ICD-10-CM | POA: Diagnosis not present

## 2023-02-23 DIAGNOSIS — I10 Essential (primary) hypertension: Secondary | ICD-10-CM | POA: Diagnosis not present

## 2023-04-07 DIAGNOSIS — Z6827 Body mass index (BMI) 27.0-27.9, adult: Secondary | ICD-10-CM | POA: Diagnosis not present

## 2023-04-07 DIAGNOSIS — R972 Elevated prostate specific antigen [PSA]: Secondary | ICD-10-CM | POA: Diagnosis not present

## 2023-04-07 DIAGNOSIS — E119 Type 2 diabetes mellitus without complications: Secondary | ICD-10-CM | POA: Diagnosis not present

## 2023-06-15 DIAGNOSIS — E119 Type 2 diabetes mellitus without complications: Secondary | ICD-10-CM | POA: Diagnosis not present

## 2023-06-15 DIAGNOSIS — Z79899 Other long term (current) drug therapy: Secondary | ICD-10-CM | POA: Diagnosis not present

## 2023-06-15 DIAGNOSIS — J449 Chronic obstructive pulmonary disease, unspecified: Secondary | ICD-10-CM | POA: Diagnosis not present

## 2023-06-15 DIAGNOSIS — E1169 Type 2 diabetes mellitus with other specified complication: Secondary | ICD-10-CM | POA: Diagnosis not present

## 2023-06-15 DIAGNOSIS — R972 Elevated prostate specific antigen [PSA]: Secondary | ICD-10-CM | POA: Diagnosis not present

## 2023-06-26 DIAGNOSIS — R972 Elevated prostate specific antigen [PSA]: Secondary | ICD-10-CM | POA: Diagnosis not present

## 2023-07-28 DIAGNOSIS — R972 Elevated prostate specific antigen [PSA]: Secondary | ICD-10-CM | POA: Diagnosis not present

## 2023-07-30 DIAGNOSIS — Z720 Tobacco use: Secondary | ICD-10-CM | POA: Diagnosis not present

## 2023-07-30 DIAGNOSIS — R972 Elevated prostate specific antigen [PSA]: Secondary | ICD-10-CM | POA: Diagnosis not present

## 2023-09-30 DIAGNOSIS — Z6828 Body mass index (BMI) 28.0-28.9, adult: Secondary | ICD-10-CM | POA: Diagnosis not present

## 2023-09-30 DIAGNOSIS — J069 Acute upper respiratory infection, unspecified: Secondary | ICD-10-CM | POA: Diagnosis not present

## 2023-10-11 DIAGNOSIS — E119 Type 2 diabetes mellitus without complications: Secondary | ICD-10-CM | POA: Diagnosis not present

## 2023-10-11 DIAGNOSIS — E1169 Type 2 diabetes mellitus with other specified complication: Secondary | ICD-10-CM | POA: Diagnosis not present

## 2023-10-11 DIAGNOSIS — I10 Essential (primary) hypertension: Secondary | ICD-10-CM | POA: Diagnosis not present

## 2023-10-11 DIAGNOSIS — Z79899 Other long term (current) drug therapy: Secondary | ICD-10-CM | POA: Diagnosis not present

## 2023-10-11 DIAGNOSIS — E785 Hyperlipidemia, unspecified: Secondary | ICD-10-CM | POA: Diagnosis not present

## 2023-10-11 DIAGNOSIS — J449 Chronic obstructive pulmonary disease, unspecified: Secondary | ICD-10-CM | POA: Diagnosis not present

## 2023-11-08 DIAGNOSIS — Z6828 Body mass index (BMI) 28.0-28.9, adult: Secondary | ICD-10-CM | POA: Diagnosis not present

## 2023-11-08 DIAGNOSIS — M62838 Other muscle spasm: Secondary | ICD-10-CM | POA: Diagnosis not present

## 2023-11-08 DIAGNOSIS — E663 Overweight: Secondary | ICD-10-CM | POA: Diagnosis not present

## 2023-11-08 DIAGNOSIS — M549 Dorsalgia, unspecified: Secondary | ICD-10-CM | POA: Diagnosis not present

## 2023-11-23 DIAGNOSIS — G44009 Cluster headache syndrome, unspecified, not intractable: Secondary | ICD-10-CM | POA: Diagnosis not present

## 2023-11-23 DIAGNOSIS — Z6826 Body mass index (BMI) 26.0-26.9, adult: Secondary | ICD-10-CM | POA: Diagnosis not present

## 2023-11-23 DIAGNOSIS — M549 Dorsalgia, unspecified: Secondary | ICD-10-CM | POA: Diagnosis not present

## 2023-12-01 DIAGNOSIS — G44011 Episodic cluster headache, intractable: Secondary | ICD-10-CM | POA: Diagnosis not present

## 2023-12-16 DIAGNOSIS — Z049 Encounter for examination and observation for unspecified reason: Secondary | ICD-10-CM | POA: Diagnosis not present

## 2023-12-16 DIAGNOSIS — G44019 Episodic cluster headache, not intractable: Secondary | ICD-10-CM | POA: Diagnosis not present

## 2023-12-16 DIAGNOSIS — G44009 Cluster headache syndrome, unspecified, not intractable: Secondary | ICD-10-CM | POA: Diagnosis not present

## 2023-12-17 DIAGNOSIS — M791 Myalgia, unspecified site: Secondary | ICD-10-CM | POA: Diagnosis not present

## 2023-12-17 DIAGNOSIS — G518 Other disorders of facial nerve: Secondary | ICD-10-CM | POA: Diagnosis not present

## 2023-12-17 DIAGNOSIS — M542 Cervicalgia: Secondary | ICD-10-CM | POA: Diagnosis not present

## 2023-12-17 DIAGNOSIS — G44009 Cluster headache syndrome, unspecified, not intractable: Secondary | ICD-10-CM | POA: Diagnosis not present

## 2024-01-04 DIAGNOSIS — G44009 Cluster headache syndrome, unspecified, not intractable: Secondary | ICD-10-CM | POA: Diagnosis not present

## 2024-01-04 DIAGNOSIS — M542 Cervicalgia: Secondary | ICD-10-CM | POA: Diagnosis not present

## 2024-01-04 DIAGNOSIS — M791 Myalgia, unspecified site: Secondary | ICD-10-CM | POA: Diagnosis not present

## 2024-01-04 DIAGNOSIS — G518 Other disorders of facial nerve: Secondary | ICD-10-CM | POA: Diagnosis not present

## 2024-01-07 DIAGNOSIS — M542 Cervicalgia: Secondary | ICD-10-CM | POA: Diagnosis not present

## 2024-01-07 DIAGNOSIS — M25511 Pain in right shoulder: Secondary | ICD-10-CM | POA: Diagnosis not present

## 2024-01-19 DIAGNOSIS — M791 Myalgia, unspecified site: Secondary | ICD-10-CM | POA: Diagnosis not present

## 2024-01-19 DIAGNOSIS — N182 Chronic kidney disease, stage 2 (mild): Secondary | ICD-10-CM | POA: Diagnosis not present

## 2024-01-19 DIAGNOSIS — E119 Type 2 diabetes mellitus without complications: Secondary | ICD-10-CM | POA: Diagnosis not present

## 2024-01-19 DIAGNOSIS — G44009 Cluster headache syndrome, unspecified, not intractable: Secondary | ICD-10-CM | POA: Diagnosis not present

## 2024-01-19 DIAGNOSIS — E785 Hyperlipidemia, unspecified: Secondary | ICD-10-CM | POA: Diagnosis not present

## 2024-01-19 DIAGNOSIS — E1169 Type 2 diabetes mellitus with other specified complication: Secondary | ICD-10-CM | POA: Diagnosis not present

## 2024-01-19 DIAGNOSIS — M542 Cervicalgia: Secondary | ICD-10-CM | POA: Diagnosis not present

## 2024-01-19 DIAGNOSIS — G518 Other disorders of facial nerve: Secondary | ICD-10-CM | POA: Diagnosis not present

## 2024-01-27 DIAGNOSIS — M542 Cervicalgia: Secondary | ICD-10-CM | POA: Diagnosis not present

## 2024-01-27 DIAGNOSIS — G518 Other disorders of facial nerve: Secondary | ICD-10-CM | POA: Diagnosis not present

## 2024-01-27 DIAGNOSIS — M791 Myalgia, unspecified site: Secondary | ICD-10-CM | POA: Diagnosis not present

## 2024-01-27 DIAGNOSIS — G44009 Cluster headache syndrome, unspecified, not intractable: Secondary | ICD-10-CM | POA: Diagnosis not present

## 2024-02-03 DIAGNOSIS — M542 Cervicalgia: Secondary | ICD-10-CM | POA: Diagnosis not present

## 2024-02-03 DIAGNOSIS — G44009 Cluster headache syndrome, unspecified, not intractable: Secondary | ICD-10-CM | POA: Diagnosis not present

## 2024-02-03 DIAGNOSIS — M791 Myalgia, unspecified site: Secondary | ICD-10-CM | POA: Diagnosis not present

## 2024-02-03 DIAGNOSIS — G518 Other disorders of facial nerve: Secondary | ICD-10-CM | POA: Diagnosis not present

## 2024-02-10 DIAGNOSIS — G44009 Cluster headache syndrome, unspecified, not intractable: Secondary | ICD-10-CM | POA: Diagnosis not present

## 2024-02-10 DIAGNOSIS — M542 Cervicalgia: Secondary | ICD-10-CM | POA: Diagnosis not present

## 2024-02-10 DIAGNOSIS — M791 Myalgia, unspecified site: Secondary | ICD-10-CM | POA: Diagnosis not present

## 2024-02-10 DIAGNOSIS — G518 Other disorders of facial nerve: Secondary | ICD-10-CM | POA: Diagnosis not present

## 2024-02-11 DIAGNOSIS — R972 Elevated prostate specific antigen [PSA]: Secondary | ICD-10-CM | POA: Diagnosis not present

## 2024-02-15 DIAGNOSIS — G44019 Episodic cluster headache, not intractable: Secondary | ICD-10-CM | POA: Diagnosis not present

## 2024-02-22 DIAGNOSIS — G4710 Hypersomnia, unspecified: Secondary | ICD-10-CM | POA: Diagnosis not present

## 2024-02-22 DIAGNOSIS — E119 Type 2 diabetes mellitus without complications: Secondary | ICD-10-CM | POA: Diagnosis not present

## 2024-02-22 DIAGNOSIS — G44009 Cluster headache syndrome, unspecified, not intractable: Secondary | ICD-10-CM | POA: Diagnosis not present

## 2024-02-22 DIAGNOSIS — D692 Other nonthrombocytopenic purpura: Secondary | ICD-10-CM | POA: Diagnosis not present

## 2024-02-25 DIAGNOSIS — G518 Other disorders of facial nerve: Secondary | ICD-10-CM | POA: Diagnosis not present

## 2024-02-25 DIAGNOSIS — M791 Myalgia, unspecified site: Secondary | ICD-10-CM | POA: Diagnosis not present

## 2024-02-25 DIAGNOSIS — G44009 Cluster headache syndrome, unspecified, not intractable: Secondary | ICD-10-CM | POA: Diagnosis not present

## 2024-02-25 DIAGNOSIS — M542 Cervicalgia: Secondary | ICD-10-CM | POA: Diagnosis not present

## 2024-03-13 DIAGNOSIS — G518 Other disorders of facial nerve: Secondary | ICD-10-CM | POA: Diagnosis not present

## 2024-03-13 DIAGNOSIS — M791 Myalgia, unspecified site: Secondary | ICD-10-CM | POA: Diagnosis not present

## 2024-03-13 DIAGNOSIS — G44009 Cluster headache syndrome, unspecified, not intractable: Secondary | ICD-10-CM | POA: Diagnosis not present

## 2024-03-13 DIAGNOSIS — M542 Cervicalgia: Secondary | ICD-10-CM | POA: Diagnosis not present

## 2024-03-17 DIAGNOSIS — G44019 Episodic cluster headache, not intractable: Secondary | ICD-10-CM | POA: Diagnosis not present

## 2024-03-21 DIAGNOSIS — G44011 Episodic cluster headache, intractable: Secondary | ICD-10-CM | POA: Diagnosis not present

## 2024-03-21 DIAGNOSIS — M542 Cervicalgia: Secondary | ICD-10-CM | POA: Diagnosis not present

## 2024-03-30 DIAGNOSIS — G518 Other disorders of facial nerve: Secondary | ICD-10-CM | POA: Diagnosis not present

## 2024-03-30 DIAGNOSIS — M791 Myalgia, unspecified site: Secondary | ICD-10-CM | POA: Diagnosis not present

## 2024-03-30 DIAGNOSIS — M542 Cervicalgia: Secondary | ICD-10-CM | POA: Diagnosis not present

## 2024-03-30 DIAGNOSIS — G44009 Cluster headache syndrome, unspecified, not intractable: Secondary | ICD-10-CM | POA: Diagnosis not present

## 2024-04-11 DIAGNOSIS — G44009 Cluster headache syndrome, unspecified, not intractable: Secondary | ICD-10-CM | POA: Diagnosis not present

## 2024-04-17 DIAGNOSIS — M791 Myalgia, unspecified site: Secondary | ICD-10-CM | POA: Diagnosis not present

## 2024-04-17 DIAGNOSIS — G518 Other disorders of facial nerve: Secondary | ICD-10-CM | POA: Diagnosis not present

## 2024-04-17 DIAGNOSIS — G44009 Cluster headache syndrome, unspecified, not intractable: Secondary | ICD-10-CM | POA: Diagnosis not present

## 2024-04-17 DIAGNOSIS — M542 Cervicalgia: Secondary | ICD-10-CM | POA: Diagnosis not present

## 2024-04-21 DIAGNOSIS — E785 Hyperlipidemia, unspecified: Secondary | ICD-10-CM | POA: Diagnosis not present

## 2024-04-21 DIAGNOSIS — N182 Chronic kidney disease, stage 2 (mild): Secondary | ICD-10-CM | POA: Diagnosis not present

## 2024-04-21 DIAGNOSIS — E1169 Type 2 diabetes mellitus with other specified complication: Secondary | ICD-10-CM | POA: Diagnosis not present

## 2024-04-21 DIAGNOSIS — E119 Type 2 diabetes mellitus without complications: Secondary | ICD-10-CM | POA: Diagnosis not present

## 2024-05-04 DIAGNOSIS — M542 Cervicalgia: Secondary | ICD-10-CM | POA: Diagnosis not present

## 2024-05-04 DIAGNOSIS — G44009 Cluster headache syndrome, unspecified, not intractable: Secondary | ICD-10-CM | POA: Diagnosis not present

## 2024-05-04 DIAGNOSIS — G518 Other disorders of facial nerve: Secondary | ICD-10-CM | POA: Diagnosis not present

## 2024-05-04 DIAGNOSIS — M791 Myalgia, unspecified site: Secondary | ICD-10-CM | POA: Diagnosis not present

## 2024-05-17 DIAGNOSIS — G44019 Episodic cluster headache, not intractable: Secondary | ICD-10-CM | POA: Diagnosis not present

## 2024-06-02 DIAGNOSIS — E119 Type 2 diabetes mellitus without complications: Secondary | ICD-10-CM | POA: Diagnosis not present

## 2024-06-13 DIAGNOSIS — G44009 Cluster headache syndrome, unspecified, not intractable: Secondary | ICD-10-CM | POA: Diagnosis not present

## 2024-06-21 DIAGNOSIS — R059 Cough, unspecified: Secondary | ICD-10-CM | POA: Diagnosis not present

## 2024-06-21 DIAGNOSIS — J441 Chronic obstructive pulmonary disease with (acute) exacerbation: Secondary | ICD-10-CM | POA: Diagnosis not present

## 2024-06-21 DIAGNOSIS — M791 Myalgia, unspecified site: Secondary | ICD-10-CM | POA: Diagnosis not present

## 2024-06-21 DIAGNOSIS — R000 Tachycardia, unspecified: Secondary | ICD-10-CM | POA: Diagnosis not present

## 2024-06-21 DIAGNOSIS — Z6828 Body mass index (BMI) 28.0-28.9, adult: Secondary | ICD-10-CM | POA: Diagnosis not present

## 2024-07-12 DIAGNOSIS — G44009 Cluster headache syndrome, unspecified, not intractable: Secondary | ICD-10-CM | POA: Diagnosis not present

## 2024-07-27 DIAGNOSIS — E785 Hyperlipidemia, unspecified: Secondary | ICD-10-CM | POA: Diagnosis not present

## 2024-07-27 DIAGNOSIS — N182 Chronic kidney disease, stage 2 (mild): Secondary | ICD-10-CM | POA: Diagnosis not present

## 2024-07-27 DIAGNOSIS — E1169 Type 2 diabetes mellitus with other specified complication: Secondary | ICD-10-CM | POA: Diagnosis not present

## 2024-07-27 DIAGNOSIS — Z23 Encounter for immunization: Secondary | ICD-10-CM | POA: Diagnosis not present

## 2024-07-27 DIAGNOSIS — E119 Type 2 diabetes mellitus without complications: Secondary | ICD-10-CM | POA: Diagnosis not present

## 2024-08-07 NOTE — Progress Notes (Signed)
 Richard Clay                                          MRN: [id number]   08/07/2024   The [facility] Quality Team Specialist reviewed this patient medical record for the purposes of chart review for care gap closure. The following were reviewed: chart review for care gap closure-kidney health evaluation for diabetes:eGFR  and uACR.    [facility] Quality Team

## 2024-08-17 DIAGNOSIS — R972 Elevated prostate specific antigen [PSA]: Secondary | ICD-10-CM | POA: Diagnosis not present

## 2024-08-18 DIAGNOSIS — G44009 Cluster headache syndrome, unspecified, not intractable: Secondary | ICD-10-CM | POA: Diagnosis not present

## 2024-09-01 DIAGNOSIS — L309 Dermatitis, unspecified: Secondary | ICD-10-CM | POA: Diagnosis not present

## 2024-09-01 DIAGNOSIS — Z6826 Body mass index (BMI) 26.0-26.9, adult: Secondary | ICD-10-CM | POA: Diagnosis not present

## 2024-09-09 DIAGNOSIS — R972 Elevated prostate specific antigen [PSA]: Secondary | ICD-10-CM | POA: Diagnosis not present

## 2024-09-12 DIAGNOSIS — R972 Elevated prostate specific antigen [PSA]: Secondary | ICD-10-CM | POA: Diagnosis not present

## 2024-09-18 DIAGNOSIS — R972 Elevated prostate specific antigen [PSA]: Secondary | ICD-10-CM | POA: Diagnosis not present
